# Patient Record
Sex: Female | Born: 1950 | Hispanic: Yes | Marital: Married
Health system: Southern US, Community
[De-identification: ages and names within clinical notes are randomized; demographics above are authoritative.]

---

## 2013-02-18 ENCOUNTER — Emergency Department: Payer: Self-pay | Admitting: Emergency Medicine

## 2013-02-18 LAB — HEPATIC FUNCTION PANEL A (ARMC)
ALT: 70 U/L (ref 12–78)
Albumin: 2.9 g/dL — ABNORMAL LOW (ref 3.4–5.0)
Alkaline Phosphatase: 440 U/L — ABNORMAL HIGH
Bilirubin, Direct: 0.2 mg/dL (ref 0.00–0.20)
Bilirubin,Total: 0.5 mg/dL (ref 0.2–1.0)
SGOT(AST): 129 U/L — ABNORMAL HIGH (ref 15–37)
TOTAL PROTEIN: 7.6 g/dL (ref 6.4–8.2)

## 2013-02-18 LAB — URINALYSIS, COMPLETE
BACTERIA: NONE SEEN
BILIRUBIN, UR: NEGATIVE
Blood: NEGATIVE
Glucose,UR: NEGATIVE mg/dL (ref 0–75)
Nitrite: NEGATIVE
Ph: 5 (ref 4.5–8.0)
Specific Gravity: 1.032 (ref 1.003–1.030)
Squamous Epithelial: 9

## 2013-02-18 LAB — CBC
HCT: 39.4 % (ref 35.0–47.0)
HGB: 12.7 g/dL (ref 12.0–16.0)
MCH: 26.1 pg (ref 26.0–34.0)
MCHC: 32.2 g/dL (ref 32.0–36.0)
MCV: 81 fL (ref 80–100)
Platelet: 342 10*3/uL (ref 150–440)
RBC: 4.85 10*6/uL (ref 3.80–5.20)
RDW: 15.9 % — ABNORMAL HIGH (ref 11.5–14.5)
WBC: 18.5 10*3/uL — ABNORMAL HIGH (ref 3.6–11.0)

## 2013-02-18 LAB — BASIC METABOLIC PANEL
ANION GAP: 6 — AB (ref 7–16)
BUN: 22 mg/dL — ABNORMAL HIGH (ref 7–18)
CO2: 28 mmol/L (ref 21–32)
Calcium, Total: 9.2 mg/dL (ref 8.5–10.1)
Chloride: 103 mmol/L (ref 98–107)
Creatinine: 0.83 mg/dL (ref 0.60–1.30)
EGFR (African American): >60
EGFR (Non-African Amer.): >60
GLUCOSE: 170 mg/dL — AB (ref 65–99)
OSMOLALITY: 281 (ref 275–301)
POTASSIUM: 3.6 mmol/L (ref 3.5–5.1)
Sodium: 137 mmol/L (ref 136–145)

## 2013-02-18 LAB — PROTIME-INR
INR: 1
Prothrombin Time: 13 secs (ref 11.5–14.7)

## 2013-02-18 LAB — TROPONIN I: Troponin-I: <0.02 ng/mL

## 2013-02-19 LAB — CANCER ANTIGEN 19-9: CA 19-9: 1139 U/mL — ABNORMAL HIGH (ref 0–35)

## 2013-02-19 LAB — CEA: CEA: 787.9 ng/mL — ABNORMAL HIGH (ref 0.0–4.7)

## 2013-02-22 ENCOUNTER — Ambulatory Visit: Payer: Self-pay | Admitting: Oncology

## 2013-02-25 ENCOUNTER — Ambulatory Visit: Payer: Self-pay | Admitting: Oncology

## 2013-02-28 ENCOUNTER — Ambulatory Visit: Payer: Self-pay | Admitting: Oncology

## 2013-03-11 LAB — COMPREHENSIVE METABOLIC PANEL
ALT: 54 U/L (ref 12–78)
Albumin: 2.4 g/dL — ABNORMAL LOW (ref 3.4–5.0)
Alkaline Phosphatase: 621 U/L — ABNORMAL HIGH
Anion Gap: 10 (ref 7–16)
BILIRUBIN TOTAL: 0.5 mg/dL (ref 0.2–1.0)
BUN: 20 mg/dL — AB (ref 7–18)
CALCIUM: 9.9 mg/dL (ref 8.5–10.1)
CREATININE: 0.72 mg/dL (ref 0.60–1.30)
Chloride: 101 mmol/L (ref 98–107)
Co2: 22 mmol/L (ref 21–32)
EGFR (African American): >60
EGFR (Non-African Amer.): >60
Glucose: 127 mg/dL — ABNORMAL HIGH (ref 65–99)
Osmolality: 271 (ref 275–301)
POTASSIUM: 4 mmol/L (ref 3.5–5.1)
SGOT(AST): 142 U/L — ABNORMAL HIGH (ref 15–37)
Sodium: 133 mmol/L — ABNORMAL LOW (ref 136–145)
Total Protein: 8.1 g/dL (ref 6.4–8.2)

## 2013-03-11 LAB — CBC WITH DIFFERENTIAL/PLATELET
BASOS PCT: 1.2 %
Basophil #: 0.2 10*3/uL — ABNORMAL HIGH (ref 0.0–0.1)
EOS PCT: 4.9 %
Eosinophil #: 0.8 10*3/uL — ABNORMAL HIGH (ref 0.0–0.7)
HCT: 35.2 % (ref 35.0–47.0)
HGB: 11.5 g/dL — AB (ref 12.0–16.0)
Lymphocyte #: 2.6 10*3/uL (ref 1.0–3.6)
Lymphocyte %: 14.8 %
MCH: 26.4 pg (ref 26.0–34.0)
MCHC: 32.5 g/dL (ref 32.0–36.0)
MCV: 81 fL (ref 80–100)
MONOS PCT: 7.9 %
Monocyte #: 1.4 x10 3/mm — ABNORMAL HIGH (ref 0.2–0.9)
NEUTROS PCT: 71.2 %
Neutrophil #: 12.3 10*3/uL — ABNORMAL HIGH (ref 1.4–6.5)
Platelet: 479 10*3/uL — ABNORMAL HIGH (ref 150–440)
RBC: 4.34 10*6/uL (ref 3.80–5.20)
RDW: 17.5 % — AB (ref 11.5–14.5)
WBC: 17.2 10*3/uL — AB (ref 3.6–11.0)

## 2013-03-11 LAB — URINALYSIS, COMPLETE
BILIRUBIN, UR: NEGATIVE
Bacteria: NONE SEEN
Blood: NEGATIVE
GLUCOSE, UR: NEGATIVE mg/dL (ref 0–75)
Ketone: NEGATIVE
NITRITE: NEGATIVE
Ph: 7 (ref 4.5–8.0)
Protein: NEGATIVE
RBC,UR: 2 /HPF (ref 0–5)
Specific Gravity: 1.016 (ref 1.003–1.030)
Squamous Epithelial: 11

## 2013-03-11 LAB — TROPONIN I: TROPONIN-I: 0.02 ng/mL

## 2013-03-12 LAB — BASIC METABOLIC PANEL
ANION GAP: 6 — AB (ref 7–16)
BUN: 19 mg/dL — ABNORMAL HIGH (ref 7–18)
CALCIUM: 9.6 mg/dL (ref 8.5–10.1)
CHLORIDE: 103 mmol/L (ref 98–107)
CO2: 25 mmol/L (ref 21–32)
Creatinine: 0.72 mg/dL (ref 0.60–1.30)
EGFR (Non-African Amer.): >60
Glucose: 103 mg/dL — ABNORMAL HIGH (ref 65–99)
Osmolality: 271 (ref 275–301)
POTASSIUM: 3.7 mmol/L (ref 3.5–5.1)
Sodium: 134 mmol/L — ABNORMAL LOW (ref 136–145)

## 2013-03-12 LAB — CBC WITH DIFFERENTIAL/PLATELET
BASOS ABS: 0.1 10*3/uL (ref 0.0–0.1)
Basophil %: 0.6 %
Eosinophil #: 0.5 10*3/uL (ref 0.0–0.7)
Eosinophil %: 3.9 %
HCT: 32.7 % — ABNORMAL LOW (ref 35.0–47.0)
HGB: 10.4 g/dL — AB (ref 12.0–16.0)
LYMPHS ABS: 2.7 10*3/uL (ref 1.0–3.6)
Lymphocyte %: 20.1 %
MCH: 26.2 pg (ref 26.0–34.0)
MCHC: 31.7 g/dL — AB (ref 32.0–36.0)
MCV: 83 fL (ref 80–100)
MONO ABS: 1.4 x10 3/mm — AB (ref 0.2–0.9)
Monocyte %: 10.1 %
Neutrophil #: 8.8 10*3/uL — ABNORMAL HIGH (ref 1.4–6.5)
Neutrophil %: 65.3 %
Platelet: 398 10*3/uL (ref 150–440)
RBC: 3.96 10*6/uL (ref 3.80–5.20)
RDW: 17.4 % — ABNORMAL HIGH (ref 11.5–14.5)
WBC: 13.5 10*3/uL — ABNORMAL HIGH (ref 3.6–11.0)

## 2013-03-14 ENCOUNTER — Inpatient Hospital Stay: Payer: Self-pay | Admitting: Internal Medicine

## 2013-03-15 LAB — CBC WITH DIFFERENTIAL/PLATELET
Basophil #: 0 10*3/uL (ref 0.0–0.1)
Basophil %: 0.1 %
EOS ABS: 0 10*3/uL (ref 0.0–0.7)
Eosinophil %: 0.1 %
HCT: 29.9 % — AB (ref 35.0–47.0)
HGB: 9.6 g/dL — ABNORMAL LOW (ref 12.0–16.0)
LYMPHS PCT: 10.4 %
Lymphocyte #: 1.1 10*3/uL (ref 1.0–3.6)
MCH: 26.3 pg (ref 26.0–34.0)
MCHC: 32.2 g/dL (ref 32.0–36.0)
MCV: 81 fL (ref 80–100)
MONOS PCT: 4.4 %
Monocyte #: 0.5 x10 3/mm (ref 0.2–0.9)
NEUTROS ABS: 9.1 10*3/uL — AB (ref 1.4–6.5)
Neutrophil %: 85 %
Platelet: 282 10*3/uL (ref 150–440)
RBC: 3.67 10*6/uL — ABNORMAL LOW (ref 3.80–5.20)
RDW: 17.1 % — AB (ref 11.5–14.5)
WBC: 10.7 10*3/uL (ref 3.6–11.0)

## 2013-03-15 LAB — BASIC METABOLIC PANEL
Anion Gap: 8 (ref 7–16)
BUN: 16 mg/dL (ref 7–18)
CHLORIDE: 102 mmol/L (ref 98–107)
CO2: 22 mmol/L (ref 21–32)
CREATININE: 0.67 mg/dL (ref 0.60–1.30)
Calcium, Total: 7.7 mg/dL — ABNORMAL LOW (ref 8.5–10.1)
EGFR (Non-African Amer.): >60
GLUCOSE: 155 mg/dL — AB (ref 65–99)
OSMOLALITY: 269 (ref 275–301)
Potassium: 4.3 mmol/L (ref 3.5–5.1)
Sodium: 132 mmol/L — ABNORMAL LOW (ref 136–145)

## 2013-03-17 LAB — CBC WITH DIFFERENTIAL/PLATELET
BASOS ABS: 0 10*3/uL (ref 0.0–0.1)
Basophil %: 0.1 %
Eosinophil #: 0 10*3/uL (ref 0.0–0.7)
Eosinophil %: 0 %
HCT: 31.6 % — ABNORMAL LOW (ref 35.0–47.0)
HGB: 10.3 g/dL — ABNORMAL LOW (ref 12.0–16.0)
LYMPHS PCT: 9.8 %
Lymphocyte #: 1.4 10*3/uL (ref 1.0–3.6)
MCH: 26.4 pg (ref 26.0–34.0)
MCHC: 32.5 g/dL (ref 32.0–36.0)
MCV: 81 fL (ref 80–100)
MONOS PCT: 8 %
Monocyte #: 1.2 x10 3/mm — ABNORMAL HIGH (ref 0.2–0.9)
Neutrophil #: 11.9 10*3/uL — ABNORMAL HIGH (ref 1.4–6.5)
Neutrophil %: 82.1 %
Platelet: 370 10*3/uL (ref 150–440)
RBC: 3.9 10*6/uL (ref 3.80–5.20)
RDW: 17.3 % — ABNORMAL HIGH (ref 11.5–14.5)
WBC: 14.5 10*3/uL — ABNORMAL HIGH (ref 3.6–11.0)

## 2013-03-17 LAB — COMPREHENSIVE METABOLIC PANEL
ALT: 75 U/L (ref 12–78)
ANION GAP: 7 (ref 7–16)
AST: 137 U/L — AB (ref 15–37)
Albumin: 1.8 g/dL — ABNORMAL LOW (ref 3.4–5.0)
Alkaline Phosphatase: 525 U/L — ABNORMAL HIGH
BILIRUBIN TOTAL: 0.5 mg/dL (ref 0.2–1.0)
BUN: 18 mg/dL (ref 7–18)
CALCIUM: 7.4 mg/dL — AB (ref 8.5–10.1)
CREATININE: 0.7 mg/dL (ref 0.60–1.30)
Chloride: 105 mmol/L (ref 98–107)
Co2: 23 mmol/L (ref 21–32)
EGFR (Non-African Amer.): >60
Glucose: 132 mg/dL — ABNORMAL HIGH (ref 65–99)
Osmolality: 274 (ref 275–301)
Potassium: 4.3 mmol/L (ref 3.5–5.1)
Sodium: 135 mmol/L — ABNORMAL LOW (ref 136–145)
TOTAL PROTEIN: 6.7 g/dL (ref 6.4–8.2)

## 2013-03-19 LAB — CULTURE, BLOOD (SINGLE)

## 2013-03-20 ENCOUNTER — Ambulatory Visit: Payer: Self-pay | Admitting: Oncology

## 2013-03-20 LAB — CBC WITH DIFFERENTIAL/PLATELET
BASOS ABS: 0.1 10*3/uL (ref 0.0–0.1)
BASOS PCT: 0.4 %
Eosinophil #: 0 10*3/uL (ref 0.0–0.7)
Eosinophil %: 0 %
HCT: 33 % — AB (ref 35.0–47.0)
HGB: 10.6 g/dL — AB (ref 12.0–16.0)
LYMPHS ABS: 1.2 10*3/uL (ref 1.0–3.6)
LYMPHS PCT: 6.5 %
MCH: 26.6 pg (ref 26.0–34.0)
MCHC: 32.2 g/dL (ref 32.0–36.0)
MCV: 83 fL (ref 80–100)
MONOS PCT: 6.3 %
Monocyte #: 1.2 x10 3/mm — ABNORMAL HIGH (ref 0.2–0.9)
NEUTROS ABS: 16.6 10*3/uL — AB (ref 1.4–6.5)
NEUTROS PCT: 86.8 %
PLATELETS: 300 10*3/uL (ref 150–440)
RBC: 3.99 10*6/uL (ref 3.80–5.20)
RDW: 17.7 % — ABNORMAL HIGH (ref 11.5–14.5)
WBC: 19.1 10*3/uL — ABNORMAL HIGH (ref 3.6–11.0)

## 2013-03-20 LAB — COMPREHENSIVE METABOLIC PANEL
ALBUMIN: 1.8 g/dL — AB (ref 3.4–5.0)
Alkaline Phosphatase: 426 U/L — ABNORMAL HIGH
Anion Gap: 4 — ABNORMAL LOW (ref 7–16)
BILIRUBIN TOTAL: 0.6 mg/dL (ref 0.2–1.0)
BUN: 20 mg/dL — AB (ref 7–18)
Calcium, Total: 7.2 mg/dL — ABNORMAL LOW (ref 8.5–10.1)
Chloride: 102 mmol/L (ref 98–107)
Co2: 26 mmol/L (ref 21–32)
Creatinine: 0.64 mg/dL (ref 0.60–1.30)
EGFR (African American): >60
EGFR (Non-African Amer.): >60
Glucose: 167 mg/dL — ABNORMAL HIGH (ref 65–99)
Osmolality: 271 (ref 275–301)
POTASSIUM: 4.5 mmol/L (ref 3.5–5.1)
SGOT(AST): 166 U/L — ABNORMAL HIGH (ref 15–37)
SGPT (ALT): 105 U/L — ABNORMAL HIGH (ref 12–78)
Sodium: 132 mmol/L — ABNORMAL LOW (ref 136–145)
Total Protein: 6.3 g/dL — ABNORMAL LOW (ref 6.4–8.2)

## 2013-03-21 LAB — CREATININE, SERUM
Creatinine: 0.62 mg/dL (ref 0.60–1.30)
EGFR (Non-African Amer.): >60

## 2013-03-22 LAB — COMPREHENSIVE METABOLIC PANEL
ALT: 79 U/L — AB (ref 12–78)
ANION GAP: 2 — AB (ref 7–16)
AST: 110 U/L — AB (ref 15–37)
Albumin: 1.9 g/dL — ABNORMAL LOW (ref 3.4–5.0)
Alkaline Phosphatase: 378 U/L — ABNORMAL HIGH
BILIRUBIN TOTAL: 0.7 mg/dL (ref 0.2–1.0)
BUN: 22 mg/dL — ABNORMAL HIGH (ref 7–18)
CALCIUM: 7.4 mg/dL — AB (ref 8.5–10.1)
CREATININE: 0.58 mg/dL — AB (ref 0.60–1.30)
Chloride: 105 mmol/L (ref 98–107)
Co2: 25 mmol/L (ref 21–32)
EGFR (African American): >60
Glucose: 112 mg/dL — ABNORMAL HIGH (ref 65–99)
Osmolality: 269 (ref 275–301)
Potassium: 4.7 mmol/L (ref 3.5–5.1)
Sodium: 132 mmol/L — ABNORMAL LOW (ref 136–145)
Total Protein: 6.1 g/dL — ABNORMAL LOW (ref 6.4–8.2)

## 2013-03-22 LAB — PATHOLOGY REPORT

## 2013-03-22 LAB — CBC WITH DIFFERENTIAL/PLATELET
BASOS PCT: 0.1 %
Basophil #: 0 10*3/uL (ref 0.0–0.1)
EOS ABS: 0 10*3/uL (ref 0.0–0.7)
Eosinophil %: 0.1 %
HCT: 32.8 % — AB (ref 35.0–47.0)
HGB: 10.4 g/dL — ABNORMAL LOW (ref 12.0–16.0)
Lymphocyte #: 1.2 10*3/uL (ref 1.0–3.6)
Lymphocyte %: 6.7 %
MCH: 26.3 pg (ref 26.0–34.0)
MCHC: 31.9 g/dL — ABNORMAL LOW (ref 32.0–36.0)
MCV: 83 fL (ref 80–100)
MONO ABS: 1.3 x10 3/mm — AB (ref 0.2–0.9)
MONOS PCT: 7 %
NEUTROS ABS: 15.8 10*3/uL — AB (ref 1.4–6.5)
Neutrophil %: 86.1 %
Platelet: 275 10*3/uL (ref 150–440)
RBC: 3.97 10*6/uL (ref 3.80–5.20)
RDW: 18.2 % — ABNORMAL HIGH (ref 11.5–14.5)
WBC: 18.3 10*3/uL — ABNORMAL HIGH (ref 3.6–11.0)

## 2013-03-26 LAB — CREATININE, SERUM
Creatinine: 0.66 mg/dL (ref 0.60–1.30)
EGFR (African American): >60
EGFR (Non-African Amer.): >60

## 2013-03-31 LAB — CREATININE, SERUM
Creatinine: 0.58 mg/dL — ABNORMAL LOW (ref 0.60–1.30)
EGFR (Non-African Amer.): >60

## 2013-04-17 ENCOUNTER — Ambulatory Visit: Payer: Self-pay | Admitting: Oncology

## 2013-04-17 DEATH — deceased

## 2013-04-27 ENCOUNTER — Ambulatory Visit: Payer: Self-pay | Admitting: Oncology

## 2014-06-10 NOTE — H&P (Signed)
PATIENT NAME:  Kim Snyder, Vester MR#:  [id number] DATE OF BIRTH:  November 12, 1950  DATE OF ADMISSION:  03/11/2013  PRIMARY CARE PHYSICIAN:  Dr. Otelia SergeantM.R. Williams.    REFERRING PHYSICIAN:  Dr. York CeriseForbach.   CHIEF COMPLAINT: Right-sided chest pain.   HISTORY OF PRESENT ILLNESS:  [name] is a 64 year old pleasant Hispanic female who was diagnosed with stomach cancer three weeks back, when the patient presented with left-sided flank pain. 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 The patient was seen by Dr. Orlie DakinFinnegan who planned for palliative chemo and radiation therapy, however, patient started to experience severe right-sided pain, worse with any movement. The patient received multiple doses of pain medications without much improvement.   PAST MEDICAL HISTORY: 1.  Hypertension.  2.  Hyperlipidemia.   PAST SURGICAL HISTORY: 1.  Appendectomy.  2.  Hernia repair.   SOCIAL HISTORY: No history of smoking, drinking alcohol or using illicit drugs.   ALLERGIES: NO KNOWN DRUG ALLERGIES.    HOME MEDICATIONS: 1.  Zofran 4 mg as needed.  2.  MS Contin 15 mg every 12 hours.   3.  Ibuprofen 200 mg every 4 hours.  4.  Lisinopril/hydrochlorothiazide 12.5/20 mg 2 tablets daily.  5.  Amlodipine 10 mg once a day.  6.  Percocet/Norco 5/325 mg every 4 to 6 hours as needed.   REVIEW OF SYSTEMS: CONSTITUTIONAL: Severe generalized weakness.  EYES: No change in vision.  ENT: No change in hearing.  RESPIATORY: Has no cough.  CARDIOVASCULAR: Has chest pain.  GASTROINTESTINAL: Has nausea. Has no abdominal pain.  GENITOURINARY: No dysuria or hematuria.  ENDOCRINE: No polyuria or polydipsia.  HEMATOLOGIC: No easy bruising or bleeding.  SKIN: No rash or lesions.  MUSCULOSKELETLA: Severe right-sided pain. NEUROLOGIC: No weakness or numbness in any part of the body.   PHYSICAL  EXAMINATION: GENERAL: This is a well-built, well-nourished, age-appropriate female lying down in the bed, in severe distress secondary to pain with any movement.  VITAL SIGNS: Temperature 99.3, pulse 95, blood pressure 126/84, respiratory rate of 18, oxygen saturation is 96% on 2 liters of oxygen.  HEENT: Head normocephalic, atraumatic. There is no scleral icterus. Conjunctivae are normal. Pupils equal and reactive. Extraocular movements are intact. Mucous membranes are dry. No oropharyngeal erythema.  NECK: Supple. No lymphadenopathy. No JVD. No carotid bruit. No thyromegaly.  CHEST: Has no focal tenderness.  LUNGS: Bilaterally clear to auscultation. The patient has severe tenderness on the right side of the chest.   HEART: S1, S2, regular, tachycardia.  ABDOMEN: Distended abdomen. The patient was feeling uncomfortable to palpate deep.   EXTREMITIES: No pedal edema. Pulses 2+. NEUROLOGIC: The patient is alert, oriented to place, person and time. Cranial nerves II through XII intact. Motor 5/5 in upper and lower extremities.  SKIN: No rash or lesions.   LABORATORY DATA: Urinalysis negative for nitrites and leukocyte esterase. CMP is completely within normal limits, except for AST of 142. CBC: WBC of 17,000, hemoglobin 11.5, platelet count of 479. Troponin 0.02.     ASSESSMENT AND PLAN:  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  1.  Right-sided chest pain, mostly secondary to metastasis. Will obtain chest x-ray.  Keep the patient on Dilaudid every 4 hours as needed, to start the patient on OxyContin and  Percocet.  2.  Metastatic gastric cancer. We will consult Dr. Orlie Dakin concerning about patient's serious pain.  3.  Hypertension: Will hold the hydrochlorothiazide concerning about patient's decreased p.o. intake, continue the lisinopril and amlodipine.  4.   Leukocytosis: Concerning about the patient's pain on the right side of the chest secondary to metastasis. There is also possibility of possible pneumonia. We will obtain chest x-ray; however, will start the patient on levofloxacin.  5.  Keep the patient on deep vein thrombosis prophylaxis with Lovenox.   TIME SPENT: 50 minutes.    ____________________________ Susa Griffins, MD pv:NTS D: 03/12/2013 00:43:46 ET T: 03/12/2013 01:34:12 ET JOB#: [id number]  cc: Susa Griffins, MD, <Dictator> Arville Go, M.D. Clerance Lav Ataya Murdy MD ELECTRONICALLY SIGNED 03/27/2013 4:18

## 2014-06-10 NOTE — Consult Note (Signed)
patient widespread metastatic disease in liver and bone most likely primary being in somewhat.was admitted be contracted but pain with extensive bony involvementPCA Dilaudid pump on IV Decadron and IV Toradol.  Patient received Zometais improved.  Appetite is improved.  Patient started taking oral liquid dietwas simulated for radiation therapyto social and family history has been reviewedsigns have been stableexaminationis lying in the bed comfortable.  Abdomen is soft.: clear heart within normal limit.has simulation done today and the start radiation therapy to thoracic spine for palliation of painof heart off oxygenIV as patient is tolerating oral diet and liquidof bed in the chairpatient started tolerating medication by mild she condition reached over to Decadron by mouth 8-12 mg per day dose.  And meloxicam 15 mg by mouth twice a dayToradol and Decadron can be discontinued.  May need fentanyl patch and Dilaudid as a breakthrough pain medication   Electronic Signatures: Laddie Aquashoksi, Evah Rashid K (MD)  (Signed on 28-Jan-15 13:31)  Authored  Last Updated: 28-Jan-15 13:31 by Laddie Aquashoksi, Christain Mcraney K (MD)

## 2014-06-10 NOTE — Consult Note (Signed)
History of Present Illness:  Reason for Consult Metatstaic gastric cancer   HPI   [name] is a 64 year old pleasant Hispanic female who was diagnosed with stomach cancer three weeks back, when the patient presented with left-sided flank pain. 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 The patient was seen by Dr. Grayland Ormond who planned for palliative chemo and radiation therapy, however, patient started to experience severe right-sided pain, worse with any movement. The patient received multiple doses of pain medications without much improvement.    PFSH:  Family History noncontributory   Social History negative alcohol, negative tobacco   Review of Systems:  General pain  8/10 right scapula and right hip   Performance Status (ECOG) 2   HEENT no complaints   Lungs no complaints   Cardiac no complaints   GI nausea   GU no complaints   Musculoskeletal no complaints   Extremities no complaints   Skin no complaints   Neuro no complaints   Endocrine no complaints   Psych no complaints   NURSING NOTES: ED Vital Sign Flow Sheet:   23-Jan-15 21:48   Pulse Pulse: 95   Respirations Respirations: 18   SBP SBP: 126   DBP DBP: 84   Pulse Ox % Pulse Ox %: 96   Pulse Ox Source Source: Oxygen   Pain Scale (0-10) Pain Scale (0-10): Scale:1   Physical Exam:  General ill looking female in no acute distress   HEENT: normal   Lungs: clear   Cardiac: regular rate, rhythm   Abdomen: soft  nontender   Skin: intact   Extremities: No edema, rash or cyanosis   Neuro: AAOx3   Psych: normal appearance    No Known Allergies:   Laboratory Results:  Hepatic:  23-Jan-15 18:52   Bilirubin, Total 0.5  Alkaline Phosphatase  621 (45-117 NOTE: New Reference Range 01/07/13)  SGPT (ALT) 54  SGOT (AST)  142  Total Protein, Serum  8.1  Albumin, Serum  2.4  Routine Chem:  23-Jan-15 18:52   Glucose, Serum  127  BUN  20  Creatinine (comp) 0.72  Sodium, Serum  133  Potassium, Serum 4.0  Chloride, Serum 101  CO2, Serum 22  Calcium (Total), Serum 9.9  Anion Gap 10  Osmolality (calc) 271  eGFR (African American) >60  eGFR (Non-African American) >60 (eGFR values <57m/min/1.73 m2 may be an indication of chronic kidney disease (CKD). Calculated eGFR is useful in patients with stable renal function. The eGFR calculation will not be reliable in acutely ill patients when serum creatinine is changing rapidly. It is not useful in  patients on dialysis. The eGFR calculation may not be applicable to patients at the low and high extremes of body sizes, pregnant women, and vegetarians.)  Cardiac:  23-Jan-15 18:52   Troponin I 0.02 (0.00-0.05 0.05 ng/mL or less: NEGATIVE  Repeat testing in 3-6 hrs  if clinically indicated. >0.05 ng/mL: POTENTIAL  MYOCARDIAL INJURY. Repeat  testing in 3-6 hrs if  clinically indicated. NOTE: An increase or decrease  of 30% or more on serial  testing suggests a  clinically important change)  Routine Hem:  23-Jan-15 18:52   WBC (CBC)  17.2  RBC (CBC) 4.34  Hemoglobin (CBC)  11.5  Hematocrit (CBC) 35.2  Platelet Count (CBC)  479  MCV 81  MCH 26.4  MCHC 32.5  RDW  17.5  Neutrophil % 71.2  Lymphocyte % 14.8  Monocyte % 7.9  Eosinophil % 4.9  Basophil % 1.2  Neutrophil #  12.3  Lymphocyte # 2.6  Monocyte #  1.4  Eosinophil #  0.8  Basophil #  0.2 (Result(s) reported on 11 Mar 2013 at 07:03PM.)   Radiology Results: Nuclear Med:    12-Jan-15 12:16, PET/CT Staging Stomach  PET/CT Staging Stomach   REASON FOR EXAM:    PET Initial stage gastric CA  COMMENTS:       PROCEDURE: PET - PET/CT STAGING STOMACH  - Feb 28 2013 12:16PM     CLINICAL DATA:  Initial treatment strategy for gastric cancer.    EXAM:  NUCLEAR MEDICINE PET SKULL BASE TO THIGH    FASTING BLOOD GLUCOSE:   Value: 159m/dl    TECHNIQUE:  11.2 mCi F-18 FDG was injected intravenously. CT data was obtained  and used for attenuation correction and anatomic localization only.  (This was not acquired as a diagnostic CT examination.) Additional  exam technical data entered on technologist worksheet.    COMPARISON:  None.    FINDINGS:  NECK    No hypermetabolic lymph nodes in the neck.    CHEST    Subpleural atelectasis is noted within the posterior right lung  base. No hypermetabolicpulmonary nodule or mass identified. The  trachea appears patent and is midline. There is mild cardiac  enlargement. Calcification within the mitral valve is noted. There  is no hypermetabolic mediastinal or hilar lymph node. No pericardial  effusion.    No axillary adenopathy. Left supraclavicular lymph node measures 1.3  cm. Increased FDG uptake within this lymph node has an SUV max equal  to 3.3, image 243.    ABDOMEN/PELVIS    Hypermetabolic tumor within the lumen of the gastric fundus is  noted. This has an SUV max equal to 9.9. Diffuse liver metastasis  identified. Index lesion within the anterior right hepatic lobe  measures 5.4 cm and has an SUV max equal to 11.6. Index lesion in  the lateral segment of left hepatic lobe measures 2.6 cm and has an  SUV max equal to 9.5. Inferior right hepatic lobe lesion measures  5.4 cm and has an SUV max equal to 11.0. Gallstones are identified.  These measure up to 2.1 cm. Normal appearance of the pancreas. The  spleen is on unremarkable.    Normal appearance of both adrenal glands. The kidneys are both on  unremarkable. Urinary bladder is negative. The uterus and adnexal  structures are unremarkable. Extensive hypermetabolic adenopathy is  identified within the upper abdomen. Index periaortic lymph node  measures 2.3 cm and has an SUV max equal to 4.9 There is a  retrocaval lymph node measuring 3.1 cm. This has an SUV max equal to  7.0, image 134. Enlarged  lymph nodes within the splenic hilum  exhibits malignant range FDG uptake. The SUV max is equal to 4.1,  image 175.  Large right sided hernia is again identified.    SKELETON    Multi focal hypermetabolic bone metastases are identified. Large  lytic lesion involving the T3 vertebra is identified this has an SUV  max equal to 10.0. There appears to be posterior extension of tumor  at this level into the canal which may have mass effect upon the  spinal cord. Within the right posterior iliac bone there is a lytic  lesion which measures 2.2 cm. The SUV max associated with this  lesion is equal to 8.6, image 107. Hypermetabolic lesion  involving  the posterior column of the right acetabulum and may be of  orthopedic significance.There is a large focus of intense  radiotracer uptake within the proximal right humerus has an SUV max  equal to 9.5, image 257.   IMPRESSION:  1. Hypermetabolic tumor within the fundus of the stomach is  identified compatible with primary gastric carcinoma.  2. Extensive liver metastasis  3. Hypermetabolic left supraclavicular and upper lymph nodes  compatible with metastatic adenopathy.  4. Multi focal bone metastasis. The destructive lesion involving the  T3 vertebra may be of neurosurgical significance. Consider further  assessment with thoracic spine MRI. Within the posterior column of  the right acetabulum there is a destructive bone lesion which may be  of orthopedic significance.      Electronically Signed    By: Kerby Moors M.D.    On: 02/28/2013 13:31         Verified By: Angelita Ingles, M.D.,   Assessment and Plan: Impression:   Metatstaic gastric cancer with mets to liver and bone with intractable pain Plan:   1.MEtstatic gastric cancer- follwing with Dr Grayland Ormond. Plan was to start chemotherapy 1/29.pain- right hip and right scapula- secondary to bone mets-start PCA Dilaudidsecondary to maligancy and bone mets-Zometa todayLFTs- prob due  to liver metsfollow   Electronic Signatures: Georges Mouse (MD)  (Signed 24-Jan-15 10:51)  Authored: HISTORY OF PRESENT ILLNESS, PFSH, ROS, NURSING NOTES, PE, ALLERGIES, HOME MEDICATIONS, LABS, OTHER RESULTS, ASSESSMENT AND PLAN   Last Updated: 24-Jan-15 10:51 by Georges Mouse (MD)

## 2014-06-10 NOTE — Op Note (Signed)
PATIENT NAME:  Burman NievesGUAMAN, Kim MR#:  [id number] DATE OF BIRTH:  November 15, 1950  DATE OF PROCEDURE:  03/22/2013  PREOPERATIVE DIAGNOSIS: Carcinoma of the stomach.   POSTOPERATIVE DIAGNOSIS: Carcinoma of the stomach.  PROCEDURE PERFORMED: Insertion of right IJ Infuse-a-Port with ultrasound and fluoroscopic guidance.   SURGEON: Levora DredgeGregory Schnier, M.D.   SEDATION: Versed 4 mg plus fentanyl 150 mcg administered IV. Continuous ECG, pulse oximetry and cardiopulmonary monitoring is performed throughout the entire procedure by the interventional radiology nurse. Total sedation time was 35 minutes.   ACCESS: Right IJ.   FLUOROSCOPY TIME: 0.3 minutes.   CONTRAST: None.   INDICATIONS: Kim Snyder is a 64 year old woman, who has been found to have stomach cancer and will require chemotherapy. Risks and benefits for port insertion have been reviewed. The patient has agreed to proceed.   DESCRIPTION OF PROCEDURE: The patient is taken to special procedures and placed in the supine position. After adequate sedation is achieved, her right neck and chest wall are prepped and draped in a sterile fashion. Ultrasound is placed in a sterile sleeve.   Ultrasound is then utilized to demonstrate the jugular vein, which is echolucent and compressible indicating patency. Image is recorded for the permanent record. Lidocaine 1% is then infiltrated in the soft tissues at the base of the neck as well as on the chest wall. Micropuncture needle is then used to access the jugular vein. Microwire is advanced under fluoroscopy into the inferior vena cava. Microsheath is then inserted. A small incision is created with an 11 blade scalpel. J-wire is then advanced through the microsheath and positioned with the tip in the distal IVC.   A transverse incision is made approximately 1 fingerbreadth below the clavicle. This is slightly higher than usual, but it is intended to avoid her radiation port, which has been noted with tattoos on her  anterior chest wall. A pocket is then created using both blunt and sharp dissection. It is tested for size with the port and once adequate pocket has been fashioned, the catheter is pulled subcutaneously from the pocket to the neck counter incision using the tunneling device. Dilator and peel-away sheath is advanced over the wire and the catheter is inserted into the vascular system. Under fluoroscopic guidance, the catheter tip is positioned so that it is at the atriocaval junction. Catheter is transected, the hub is connected and then slipped into the pocket. The hub is then accessed percutaneously with a Huber needle. It aspirates easily and flushes well. Fluoroscopy is used to verify the port is in good position and free of kinks with the tip at the proper location. The pocket is then closed using interrupted 3-0 Vicryl, followed by 4-0 Monocryl for the skin and Dermabond. The neck counter incision is closed with 4-0 Monocryl. The port is then accessed using a right angle Huber needle. It aspirates easily. It is flushed with 30 mL of heparinized saline and left to access so that it can be used on the floor. The patient tolerated the procedure well and there were no immediate complications.  ____________________________ Renford DillsGregory G. Schnier, MD ggs:aw D: 03/23/2013 09:46:15 ET T: 03/23/2013 10:29:54 ET JOB#: [id number]  cc: Renford DillsGregory G. Schnier, MD, <Dictator> Tollie Pizzaimothy J. Orlie DakinFinnegan, MD Lucillie GarfinkelEmma R. Mayford KnifeWilliams, MD Renford DillsGREGORY G SCHNIER MD ELECTRONICALLY SIGNED 03/31/2013 17:17

## 2014-06-10 NOTE — Discharge Summary (Signed)
PATIENT NAME:  Kim NievesGUAMAN, Ezabella MR#:  [id number] DATE OF BIRTH:  12-23-1950  DATE OF ADMISSION:  03/20/2013 DATE OF DISCHARGE:  03/31/2013    DISCHARGE DIAGNOSES:  1.  Stage IV gastric cancer with metastasis disease.  2.  Intractable pain.  3.  Atelectasis.   CONSULTATIONS: Palliative care, oncology.   ADMITTING HISTORY AND PHYSICAL AND HOSPITAL COURSE: Please refer to interim discharge summary dictated by Dr. Juliene PinaMody on 03/30/2013.   Over the last 24 hours, the patient has done better with the pain, continues to be on the PCA pump and has received radiation, and by the day of discharge, she is ready to be discharged home with hospice.   The patient does have poor prognosis. Will follow up with Dr. Orlie DakinFinnegan of oncology, and hospice.   DISCHARGE MEDICATIONS:  Include:  1.  Fentanyl patch 250 mcg q.72 hours.  2.  Dilaudid, PCA pump and by hospice, to be delivered by the CADD pump.  3.  Hydrochlorothiazide lisinopril 12.5/20 daily.  4.  Zofran 1 tablet p.r.n. as needed for nausea.  5.  Acetaminophen hydrocodone 5/325 one half tablet to 1 tablet every 4 hours as needed for pain.  6.  Nystatin 50 mL q.6 hours p.r.n. for thrush.  7.  Docusate 100 mg two tablets 2 times a day.  8.  Bisacodyl rectal suppository as needed.  9.  Lidocaine patch daily.  10.  Senna 1 tablet b.i.d.  11.  Polyethylene glycol 17 g daily p.r.n.  12.  Ensure 3 times a day 240 mL.   DISCHARGE INSTRUCTIONS: Two liters of oxygen as needed, Ensure supplementation, activity as tolerated with assistance.   Further management per hospice and oncology.   TIME SPENT ON DAY OF DISCHARGE IN DISCHARGE ACTIVITY: Was 40 minutes.   ____________________________ Molinda BailiffSrikar R. Kayin Kettering, MD srs:np D: 04/01/2013 14:49:49 ET T: 04/01/2013 15:03:34 ET JOB#: [id number]  cc: Wardell HeathSrikar R. Dearion Huot, MD, <Dictator> Orie FishermanSRIKAR R Aren Cherne MD ELECTRONICALLY SIGNED 04/07/2013 14:15

## 2015-07-04 IMAGING — CT CT ABD WO/W CM - CT PELVIS W/ CM
2 of 9 series · 13 of 46 positions shown, 15 images · IV contrast (isovue)
Comparison: No priors.

CLINICAL DATA: Pain in the left lateral rib area. Recent history of
pneumonia. Multiple liver lesions noted on recent CT of the thorax
suspicious for metastatic disease.

EXAM:
CT ABDOMEN WITHOUT AND WITH CONTRAST
CT PELVIS WITH CONTRAST
TECHNIQUE: Multidetector CT imaging of the abdomen was performed initially
following the standard protocol before administration of intravenous
contrast. Multidetector CT imaging of the abdomen and pelvis was
then performed following the standard protocol during the bolus
injection of intravenous contrast.
CONTRAST:  75 ml of Isovue 370.

[Series 6: liver venous · axial · portal-venous · 0.88mm/px · z∈[+382,+764]mm · 10 of 231 slices shown, 12 images]
[im 20/231  soft-tissue]
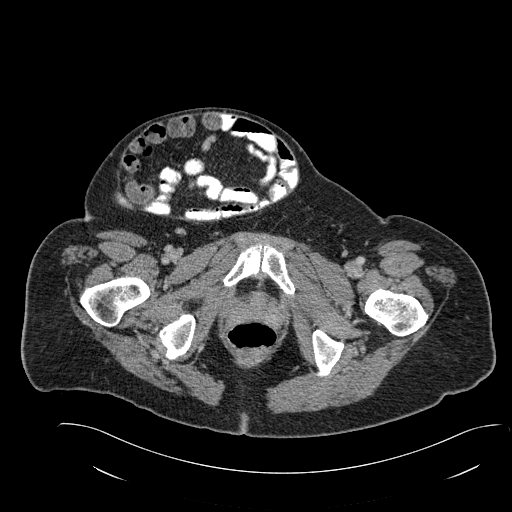
[im 20/231  bone]
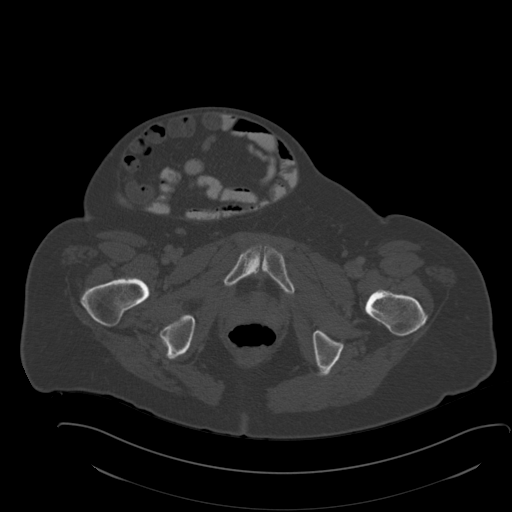
[im 39/231  soft-tissue]
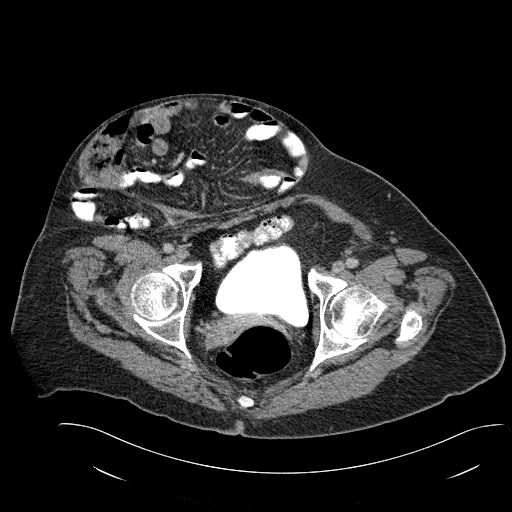
[im 58/231  soft-tissue]
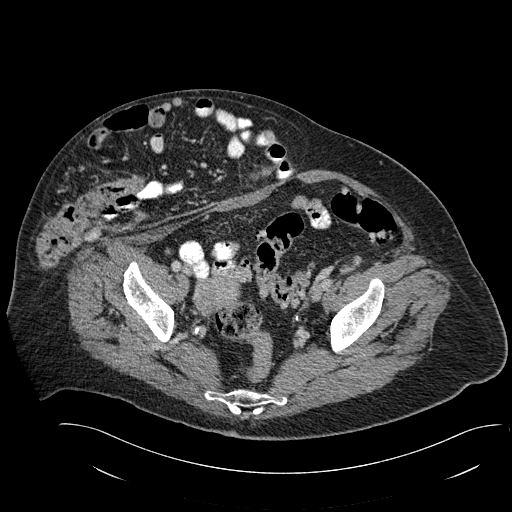
[im 77/231  soft-tissue]
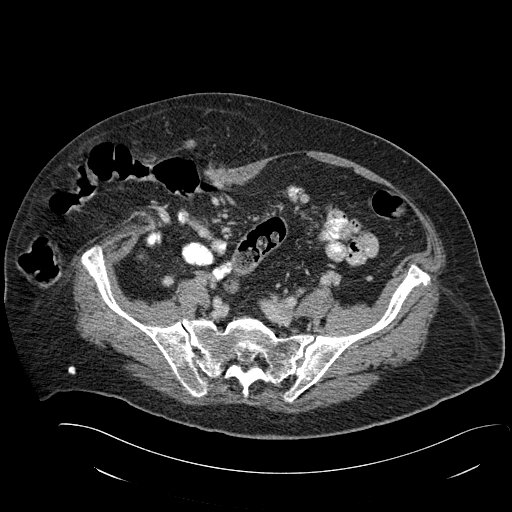
[im 96/231  soft-tissue]
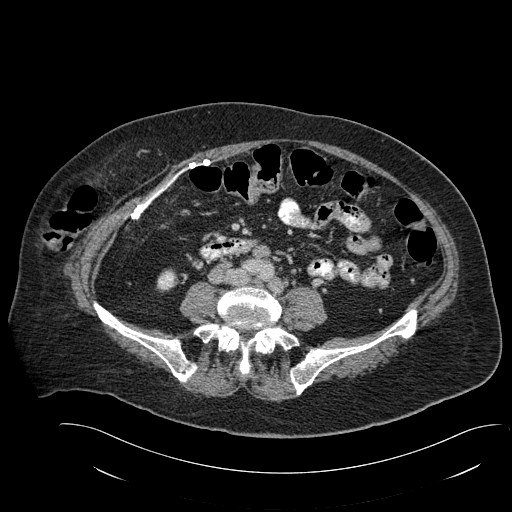
[im 135/231  soft-tissue]
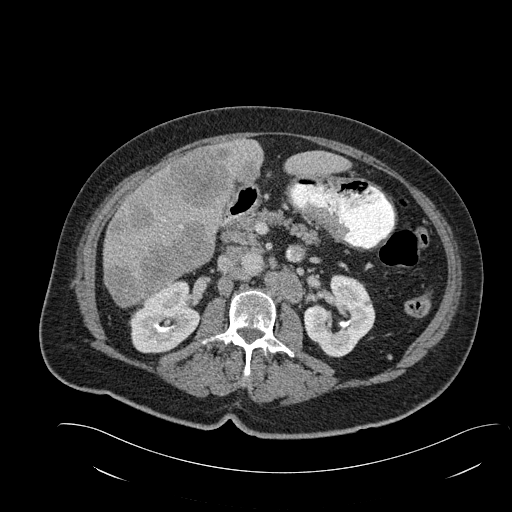
[im 154/231  soft-tissue]
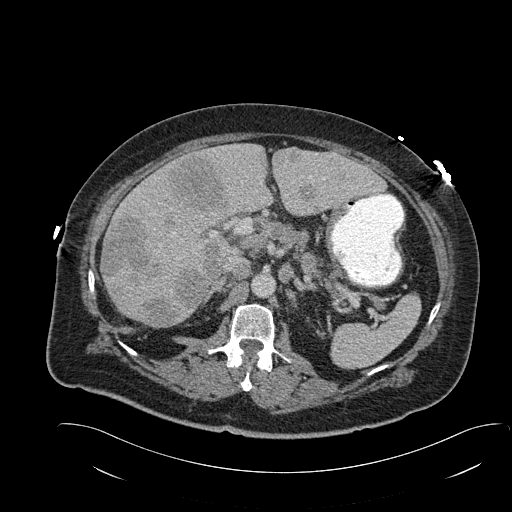
[im 173/231  soft-tissue]
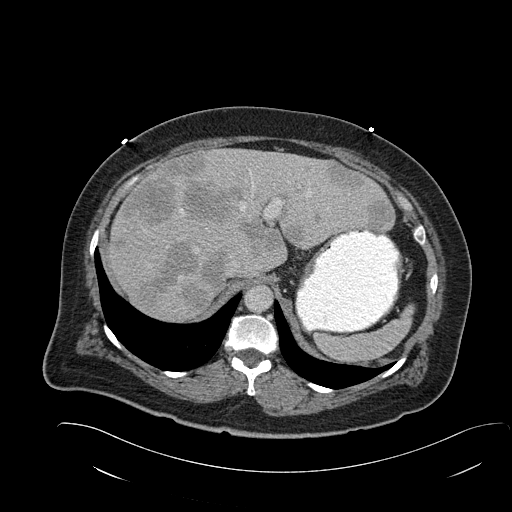
[im 192/231  soft-tissue]
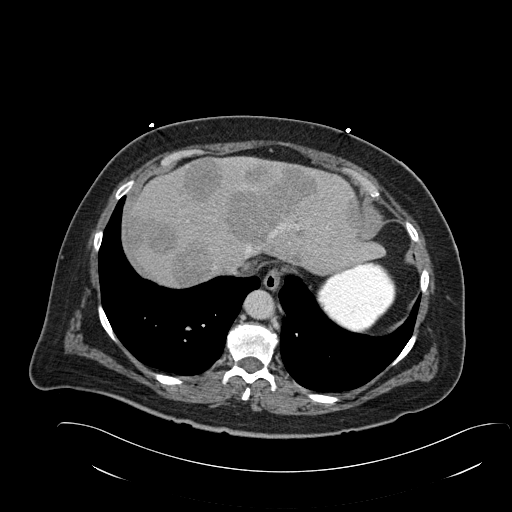
[im 192/231  bone]
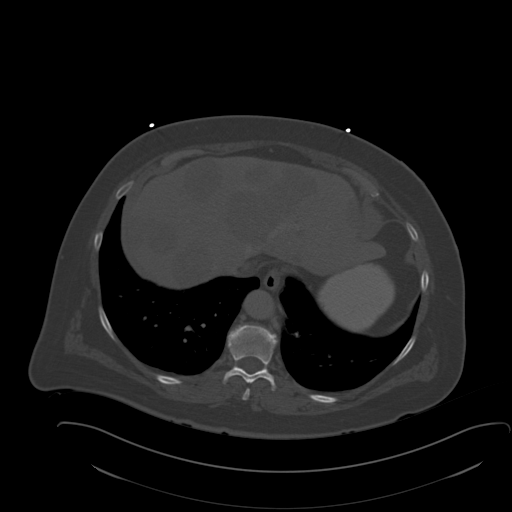
[im 211/231  soft-tissue]
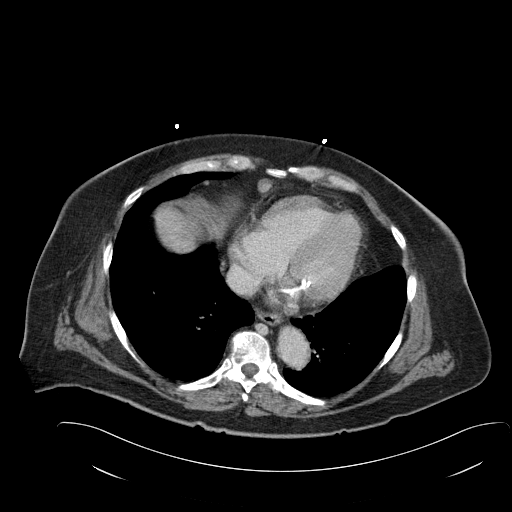

[Series 9: cor liver arterial · coronal · arterial · 0.60mm/px · 3 of 142 slices shown]
[im 36/142  soft-tissue]
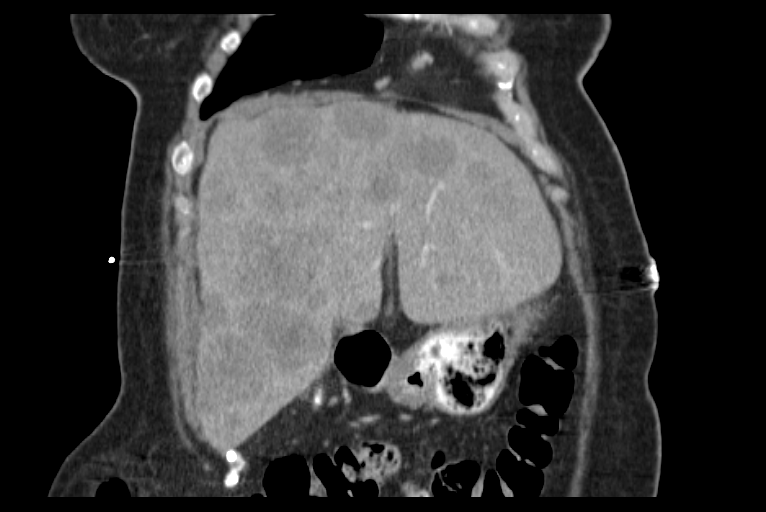
[im 71/142  soft-tissue]
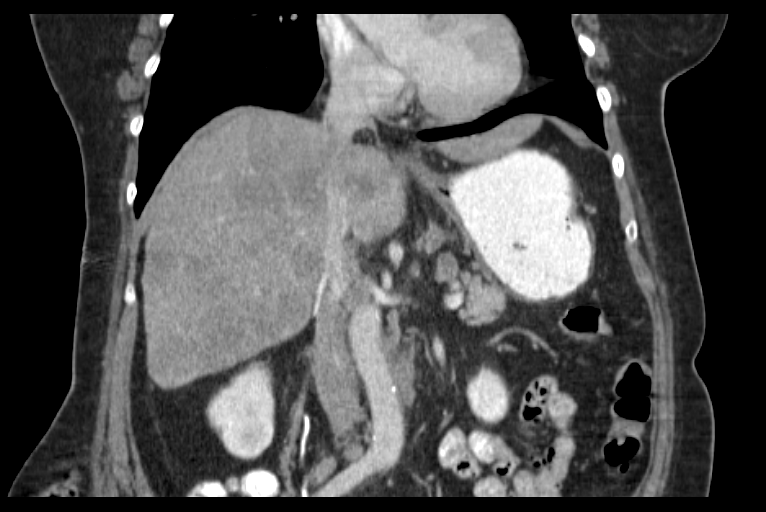
[im 106/142  soft-tissue]
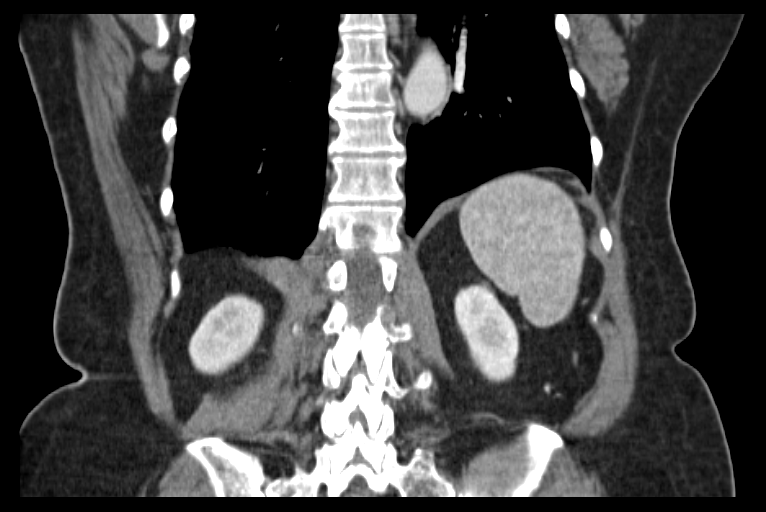

[13 of 46 positions shown; findings below may reference images not displayed]

FINDINGS: Lung Bases: Small amount of scarring in the medial aspect of the
left lower lobe. Extensive mitral annular calcifications.

Abdomen/Pelvis: Numerous hypovascular lesions are again noted
throughout the liver, largest of which is in the central aspect of
the liver involving segments 4A/4B measuring approximately 8.1 x
cm (image 70 of series 6). The overall appearance is most suggestive
of widespread metastatic disease to the liver. No intrahepatic
biliary ductal dilatation. Gallbladder appears nearly completely
decompressed around multiple large calcified gallstones.

The appearance of the pancreas, spleen, bilateral adrenal glands and
the right kidney is unremarkable. Numerous sub cm low-attenuation
lesions are noted in the left kidney which are too small to
definitively characterize. In the region of the splenic hilum there
is a 2.0 x 1.4 cm soft tissue lesion suspicious for an enlarged
lymph node. Additionally, there are numerous enlarged and borderline
enlarged retroperitoneal lymph nodes, with some of the largest of
these lesions measuring up to 3.3 x 2.3 cm and 4.1 x 2.4 cm in the
upper left and right para-aortic nodal stations respectively (images
91 and 110 of series 4). This retroperitoneal lymphadenopathy
extends toward the pelvis, where there are also bilateral common
iliac lymph nodes, largest of which on the right measures 1.2 x
cm. There are also multiple enlarged lymph nodes adjacent to the
body of the pancreas, predominantly within the gastrohepatic
ligament, with the largest node measuring up to 3.0 x 2.1 cm (image
82 of series 4). Enlarged and partially necrotic portacaval lymph
node is also noted measuring up to 3.6 x 1.8 cm.

Images 85-102 of series 6 demonstrates some asymmetric mural
thickening in the body of the stomach along predominantly the lesser
curvature.

Numerous colonic diverticulae are noted, particularly in the region
of the sigmoid colon, without surrounding inflammatory changes to
suggest an acute diverticulitis at this time. There is a large
right-sided Spigelian hernia inferiorly, beneath what appears to be
an old mesh repair, containing several loops of small bowel and a
portion of the ascending colon and proximal transverse colon. No
evidence of bowel incarceration or obstruction at this time. No
significant volume of ascites. No pneumoperitoneum.

Musculoskeletal: In the T10 vertebral body on both the left in the
right side true there are ill-defined lucent lesions, suspicious for
metastases best demonstrated on images 23 and 25 of series 4.
IMPRESSION: 1. Findings, as above, compatible with widespread metastatic disease
to the liver, retroperitoneal lymph nodes and lymph nodes in the
upper abdominal ligaments.
2. The primary malignancy is not confidently identified on today's
study, however, there is some asymmetric mural thickening in the
gastric body along the lesser curvature which warrants further
evaluation with endoscopy to exclude a gastric malignancy.
3. Lucent lesions in T10 vertebral body suspicious for metastatic
disease.
4. Large right-sided Spigelian hernia containing loops of small
bowel and colon, without evidence of bowel incarceration or
obstruction at this time.
5. Cholelithiasis without findings to suggest acute cholecystitis at
this time.
6. Additional incidental findings, as above.

## 2015-07-04 IMAGING — CT CT ANGIO CHEST
2 of 6 series · 18 of 36 positions shown · IV contrast (APPLIED)
Comparison: DG RIBS 2V*L* dated 02/18/2013; DG CHEST 2V dated
02/18/2013

CLINICAL DATA: Chest pain, tachycardia

EXAM:
CT ANGIOGRAPHY CHEST WITH CONTRAST
TECHNIQUE: Multidetector CT imaging of the chest was performed using the
standard protocol during bolus administration of intravenous
contrast. Multiplanar CT image reconstructions including MIPs were
obtained to evaluate the vascular anatomy.
CONTRAST:  100 cc Isovue

[Series 6: pe 1.0 thins · axial · 0.70mm/px · z∈[+736,+988]mm · 17 of 284 slices shown]
[im 16/284  lung]
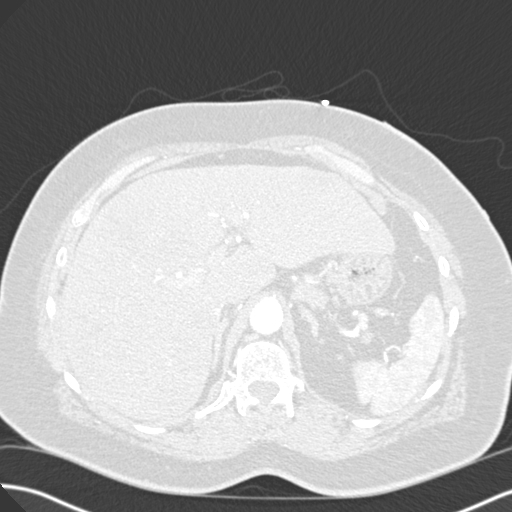
[im 32/284  mediastinal]
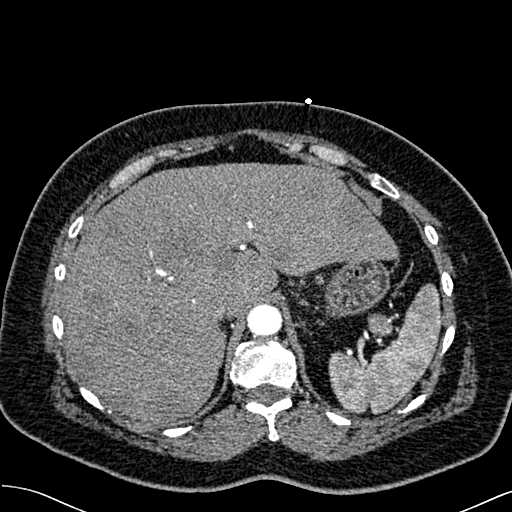
[im 48/284  lung]
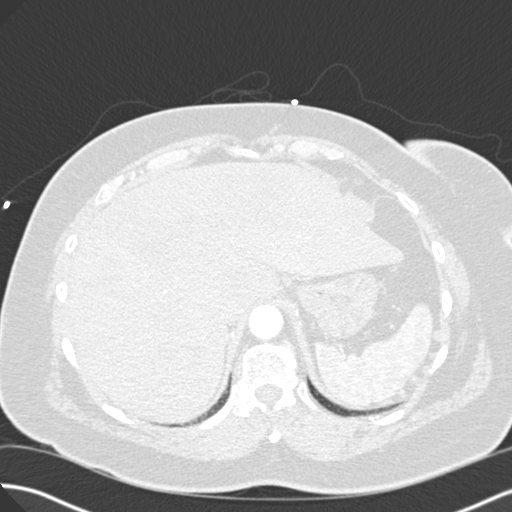
[im 63/284  mediastinal]
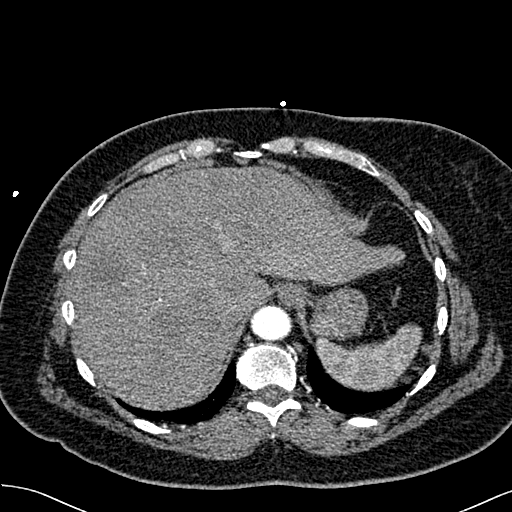
[im 79/284  lung]
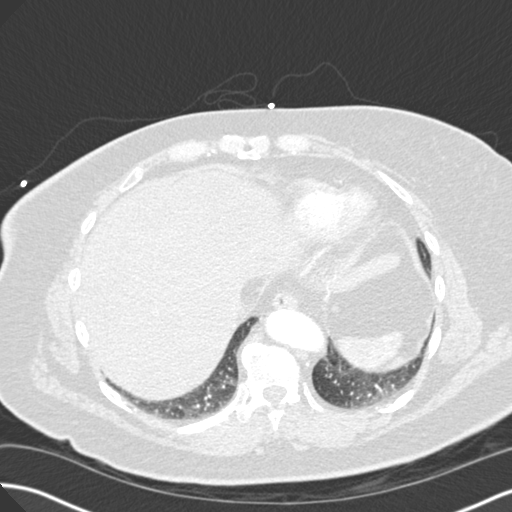
[im 95/284  mediastinal]
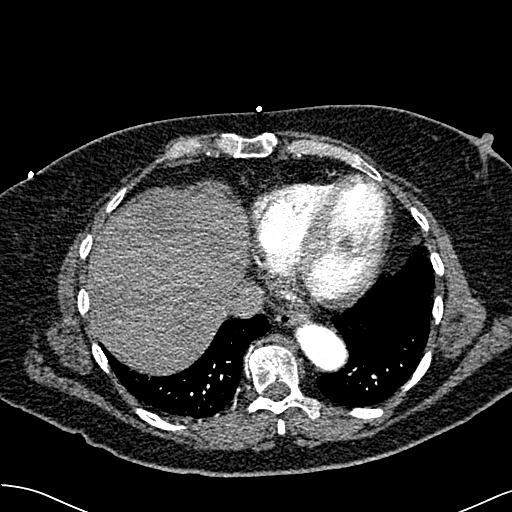
[im 111/284  lung]
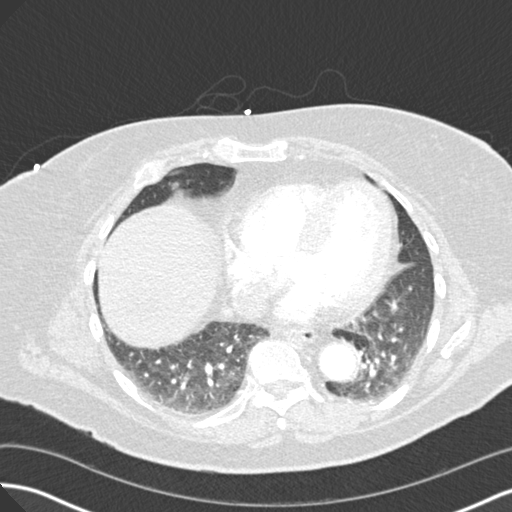
[im 126/284  mediastinal]
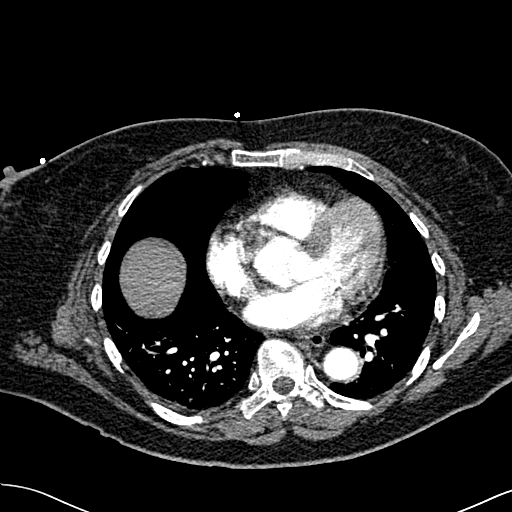
[im 142/284  lung]
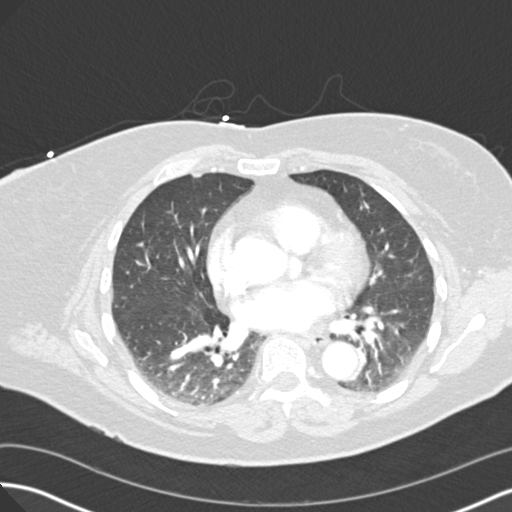
[im 158/284  mediastinal]
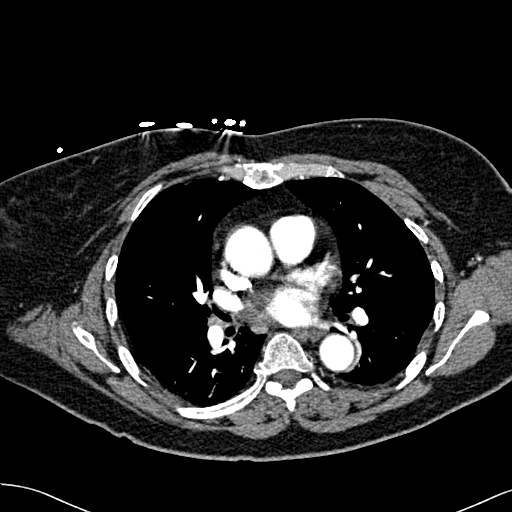
[im 173/284  lung]
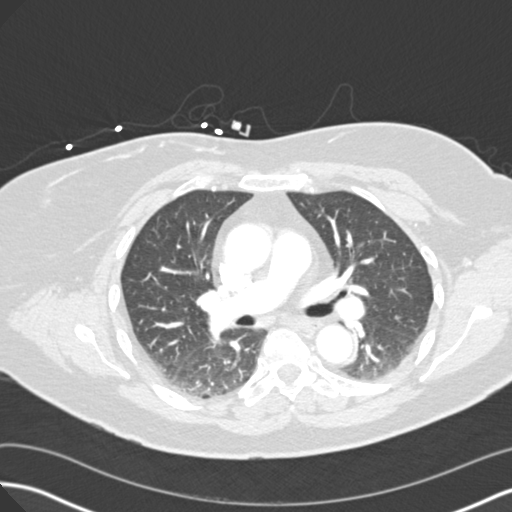
[im 189/284  mediastinal]
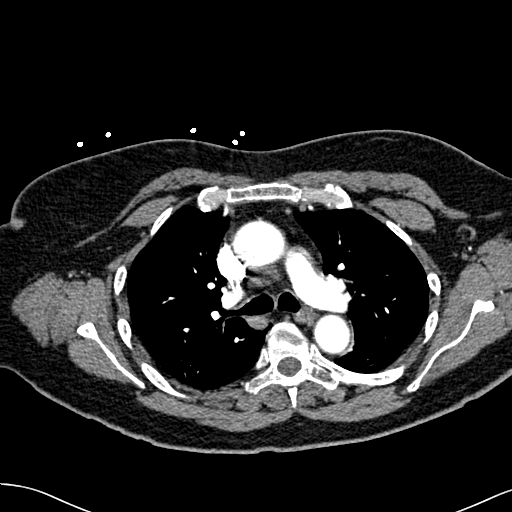
[im 205/284  lung]
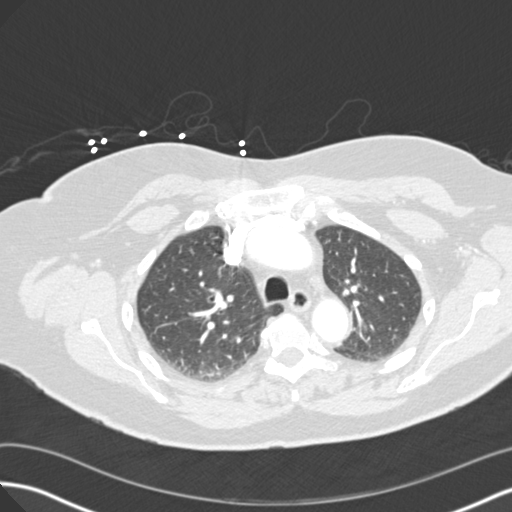
[im 221/284  mediastinal]
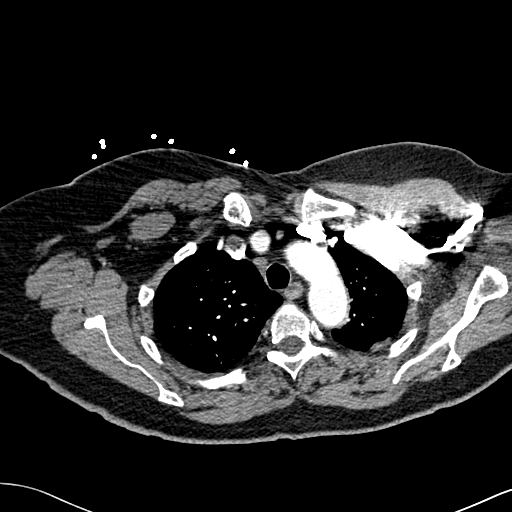
[im 236/284  lung]
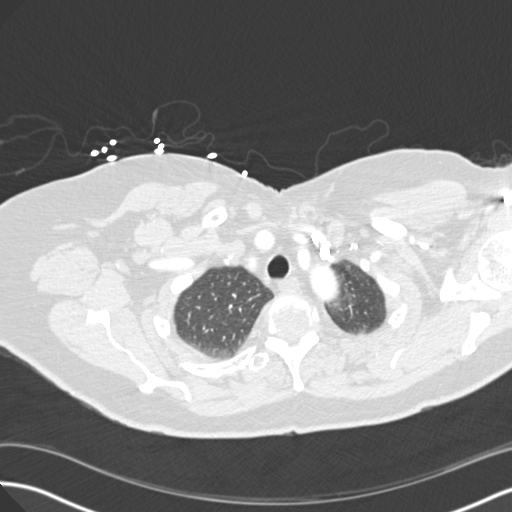
[im 252/284  mediastinal]
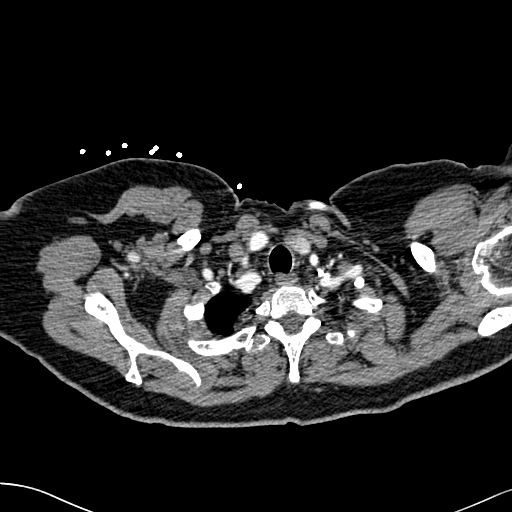
[im 268/284  lung]
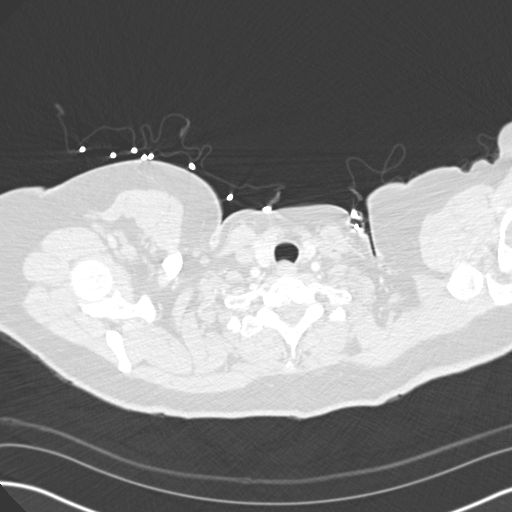

[Series 8: cor pe 2.0 mpr · coronal · 0.54mm/px · 1 of 114 slices shown]
[im 57/114  mediastinal]
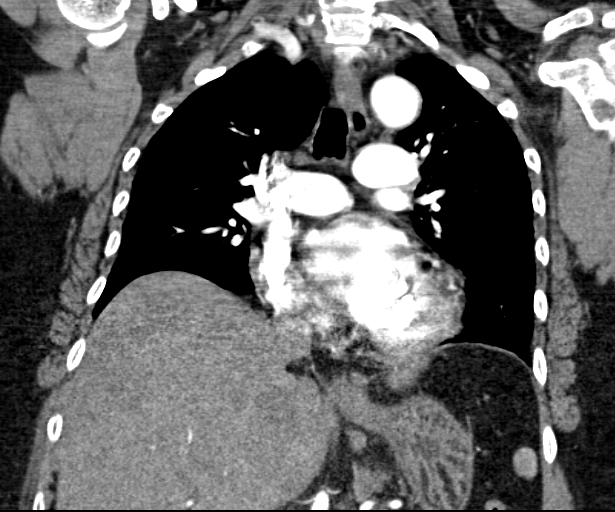

[18 of 36 positions shown; findings below may reference images not displayed]

FINDINGS: There are no filling defects within the pulmonary arteries to
suggest acute pulmonary embolism. No acute findings of the aorta or
great vessels. No pericardial fluid. No mediastinal lymphadenopathy.
Esophagus is normal.

Review of the lung parenchyma demonstrates no suspicious pulmonary
nodules.

Limited view of the upper abdomen demonstrate multiple subtle round
low-attenuation lesions within the left and right hepatic lobe.
These are difficult to measure but range in size from 1-3 cm.

Potential pathologic lymph node in the gastrohepatic ligament on
image 142 measuring 14 mm. Adjacent lymph node measures 10 mm (image
135).

Limited view of the skeleton demonstrates a lytic lesion within the
T3 vertebral body (image 26, series 5 and sagittal image 94). This
lesion erodes the posterior wall of the vertebral body body and
appears to encroach upon the central canal.

Review of the MIP images confirms the above findings.
IMPRESSION: 1. No evidence acute pulmonary embolism.
2. Unfortunately there are multiple low-density lesions in the liver
most consistent with hepatic metastasis. Recommend CT abdomen pelvis
with contrast to evaluate for primary malignancy which could be
colorectal.
3. Concern for metastatic adenopathy in the gastrohepatic ligament.
4. Lytic lesion in the T3 vertebral body consistent with skeletal
metastasis. This lesion encroaches upon the central canal. If
patient has neurologic symptoms localizing to this level, consider
MRI of the thoracis spine with contrast.
5. Patient may ultimately benefit from an outpatient FDG PET scan
for complete staging.
Findings conveyed Alphonso Villafana on 02/18/2013  at[DATE].
